# Patient Record
Sex: Female | Born: 1955 | Race: Black or African American | Hispanic: No | Marital: Single | Smoking: Current every day smoker
Health system: Southern US, Community
[De-identification: ages and names within clinical notes are randomized; demographics above are authoritative.]

## PROBLEM LIST (undated history)

## (undated) DIAGNOSIS — E785 Hyperlipidemia, unspecified: Secondary | ICD-10-CM

## (undated) DIAGNOSIS — K219 Gastro-esophageal reflux disease without esophagitis: Secondary | ICD-10-CM

## (undated) DIAGNOSIS — I639 Cerebral infarction, unspecified: Secondary | ICD-10-CM

## (undated) DIAGNOSIS — I10 Essential (primary) hypertension: Secondary | ICD-10-CM

## (undated) HISTORY — DX: Hyperlipidemia, unspecified: E78.5

## (undated) HISTORY — DX: Gastro-esophageal reflux disease without esophagitis: K21.9

## (undated) HISTORY — PX: APPENDECTOMY: SHX54

## (undated) HISTORY — DX: Essential (primary) hypertension: I10

## (undated) HISTORY — DX: Cerebral infarction, unspecified: I63.9

---

## 2010-11-05 ENCOUNTER — Inpatient Hospital Stay: Payer: Self-pay | Admitting: Internal Medicine

## 2010-11-12 ENCOUNTER — Emergency Department: Payer: Self-pay | Admitting: Unknown Physician Specialty

## 2011-01-18 ENCOUNTER — Inpatient Hospital Stay: Payer: Self-pay | Admitting: Internal Medicine

## 2013-06-15 IMAGING — CT CT ANGIOGRAPHY NECK
1 of 4 series · 12 of 33 positions shown · IV contrast (APPLIED)
Comparison: none

REASON FOR EXAM: left sided weakness, right carotid stenosis
COMMENTS:

PROCEDURE:     CT  - CT ANGIOGRAPHY NECK W/CONTRAST  - November 05, 2010  [DATE]
RESULT:
HISTORY: Left-sided weakness.

[Series 7: soft tissue · axial · 0.39mm/px · z∈[-302,-68]mm · 12 of 94 slices shown]
[im 8/94  soft-tissue]
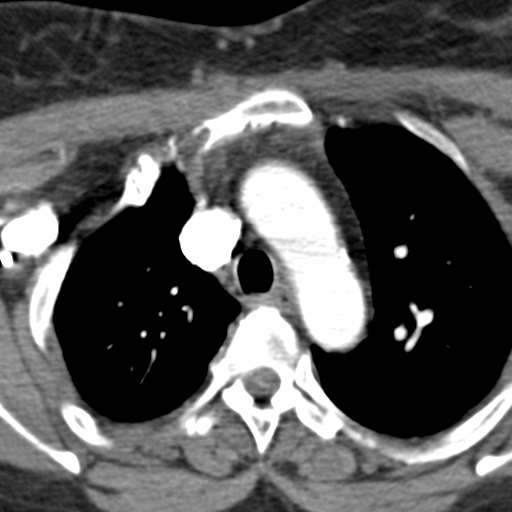
[im 15/94  bone]
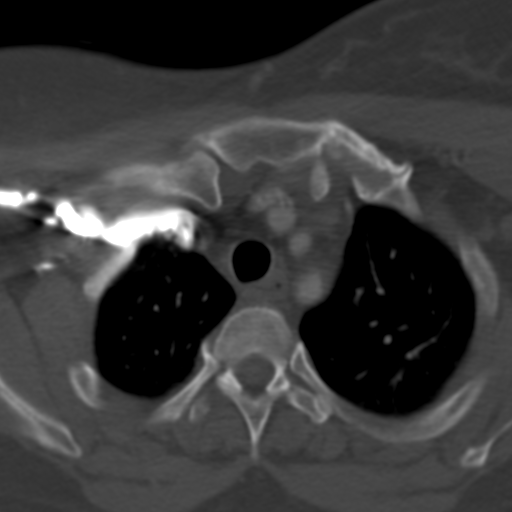
[im 22/94  soft-tissue]
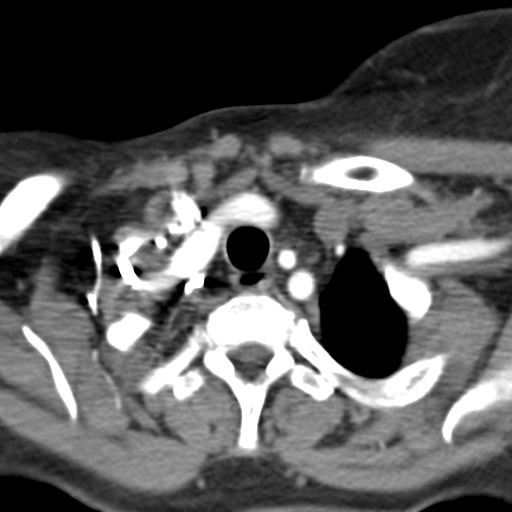
[im 29/94  bone]
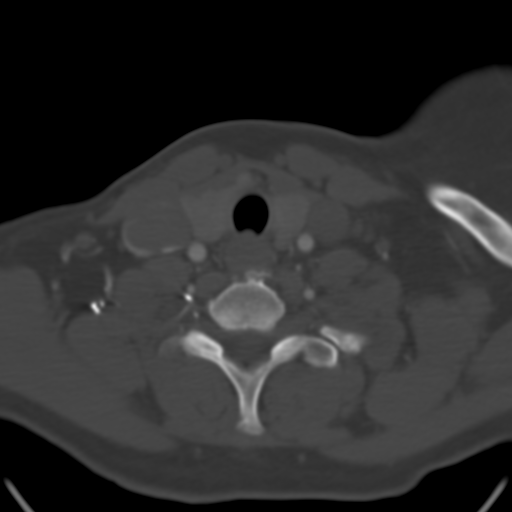
[im 36/94  soft-tissue]
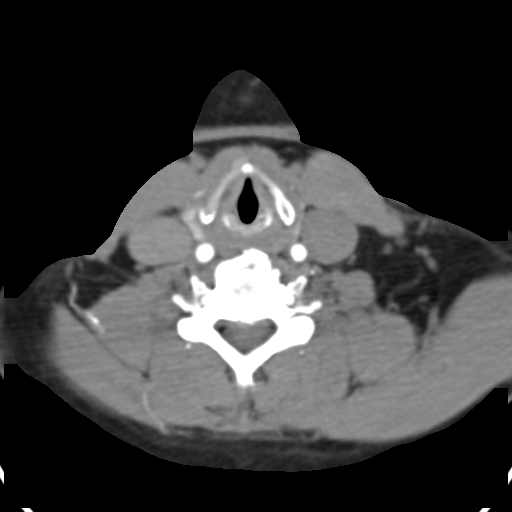
[im 43/94  bone]
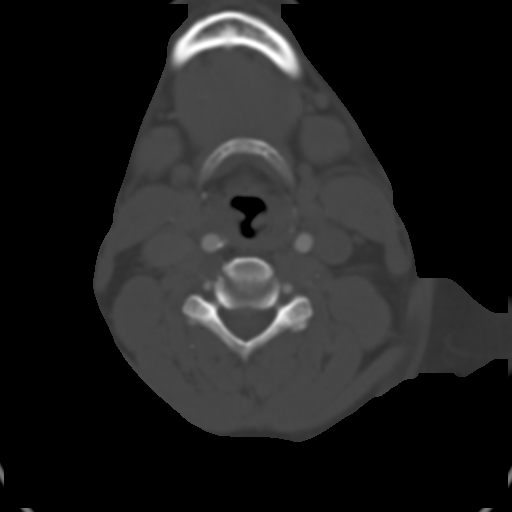
[im 51/94  soft-tissue]
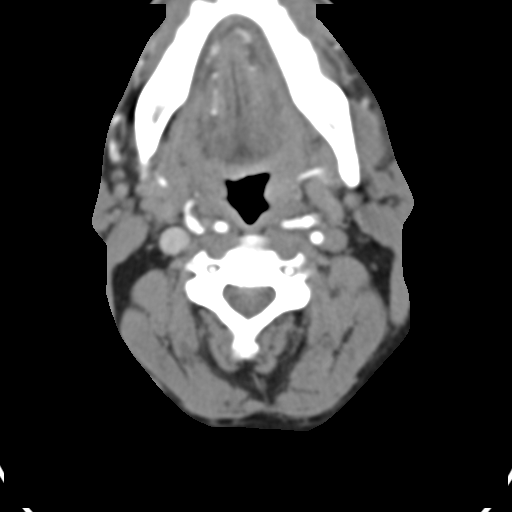
[im 58/94  bone]
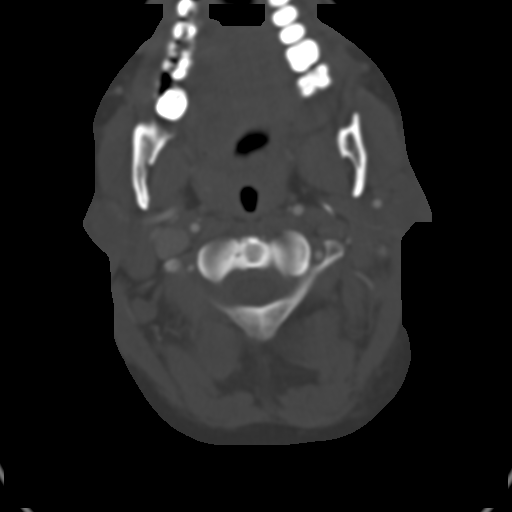
[im 65/94  soft-tissue]
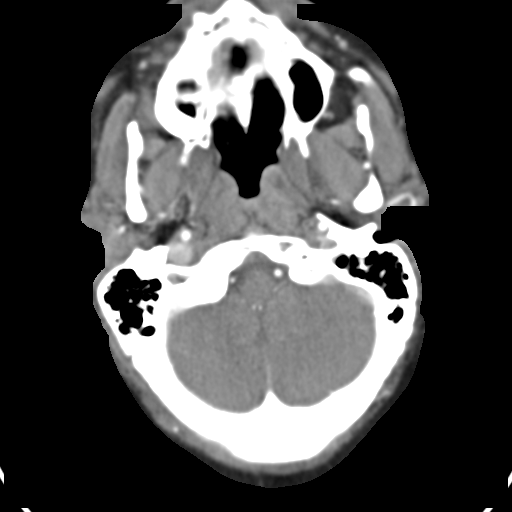
[im 72/94  bone]
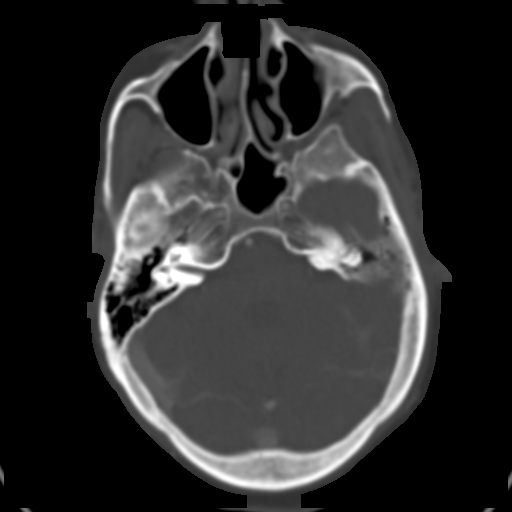
[im 79/94  soft-tissue]
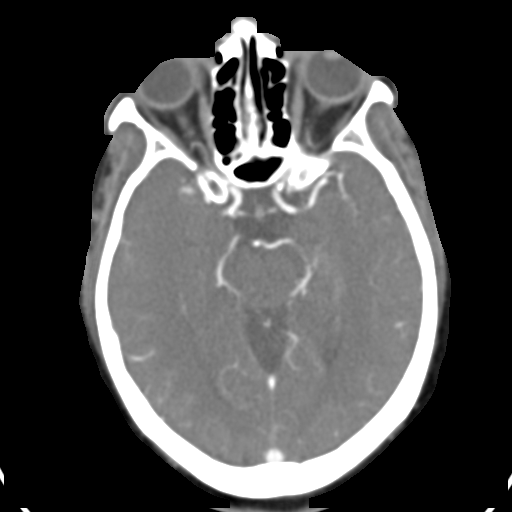
[im 86/94  bone]
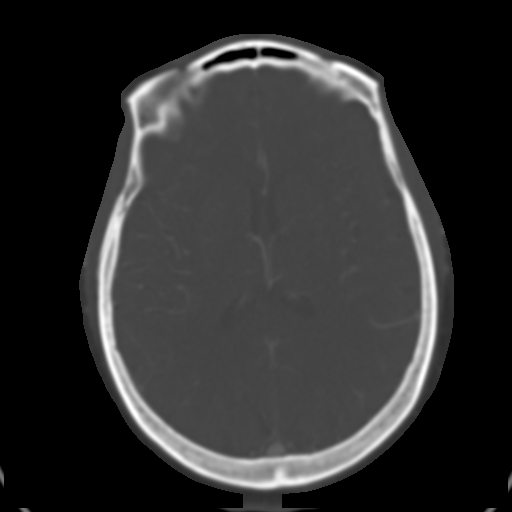

[12 of 33 positions shown; findings below may reference images not displayed]

FINDINGS: Standard CTA obtained with 100 mL of [id number]. Shotty cervical
lymph nodes are noted. Calcific atherosclerotic vascular disease is noted
with a right carotid bifurcation prominent stenosis. Proximal right internal
carotid artery stenosis is also present. The degree of stenosis is up to
75%. Mild calcific stenosis is noted of the left carotid bifurcation.
IMPRESSION: Changes suggesting hemodynamically significant stenosis right carotid
bifurcation and proximal internal carotid artery.

## 2013-08-28 IMAGING — CR DG CHEST 1V PORT
1 series · 1 of 1 positions shown · non-contrast
Comparison: none

REASON FOR EXAM: Chest Pain
COMMENTS:

PROCEDURE:     DXR - DXR PORTABLE CHEST SINGLE VIEW  - January 18, 2011  [DATE]
RESULT:     Comparison: None.

[view not recorded]
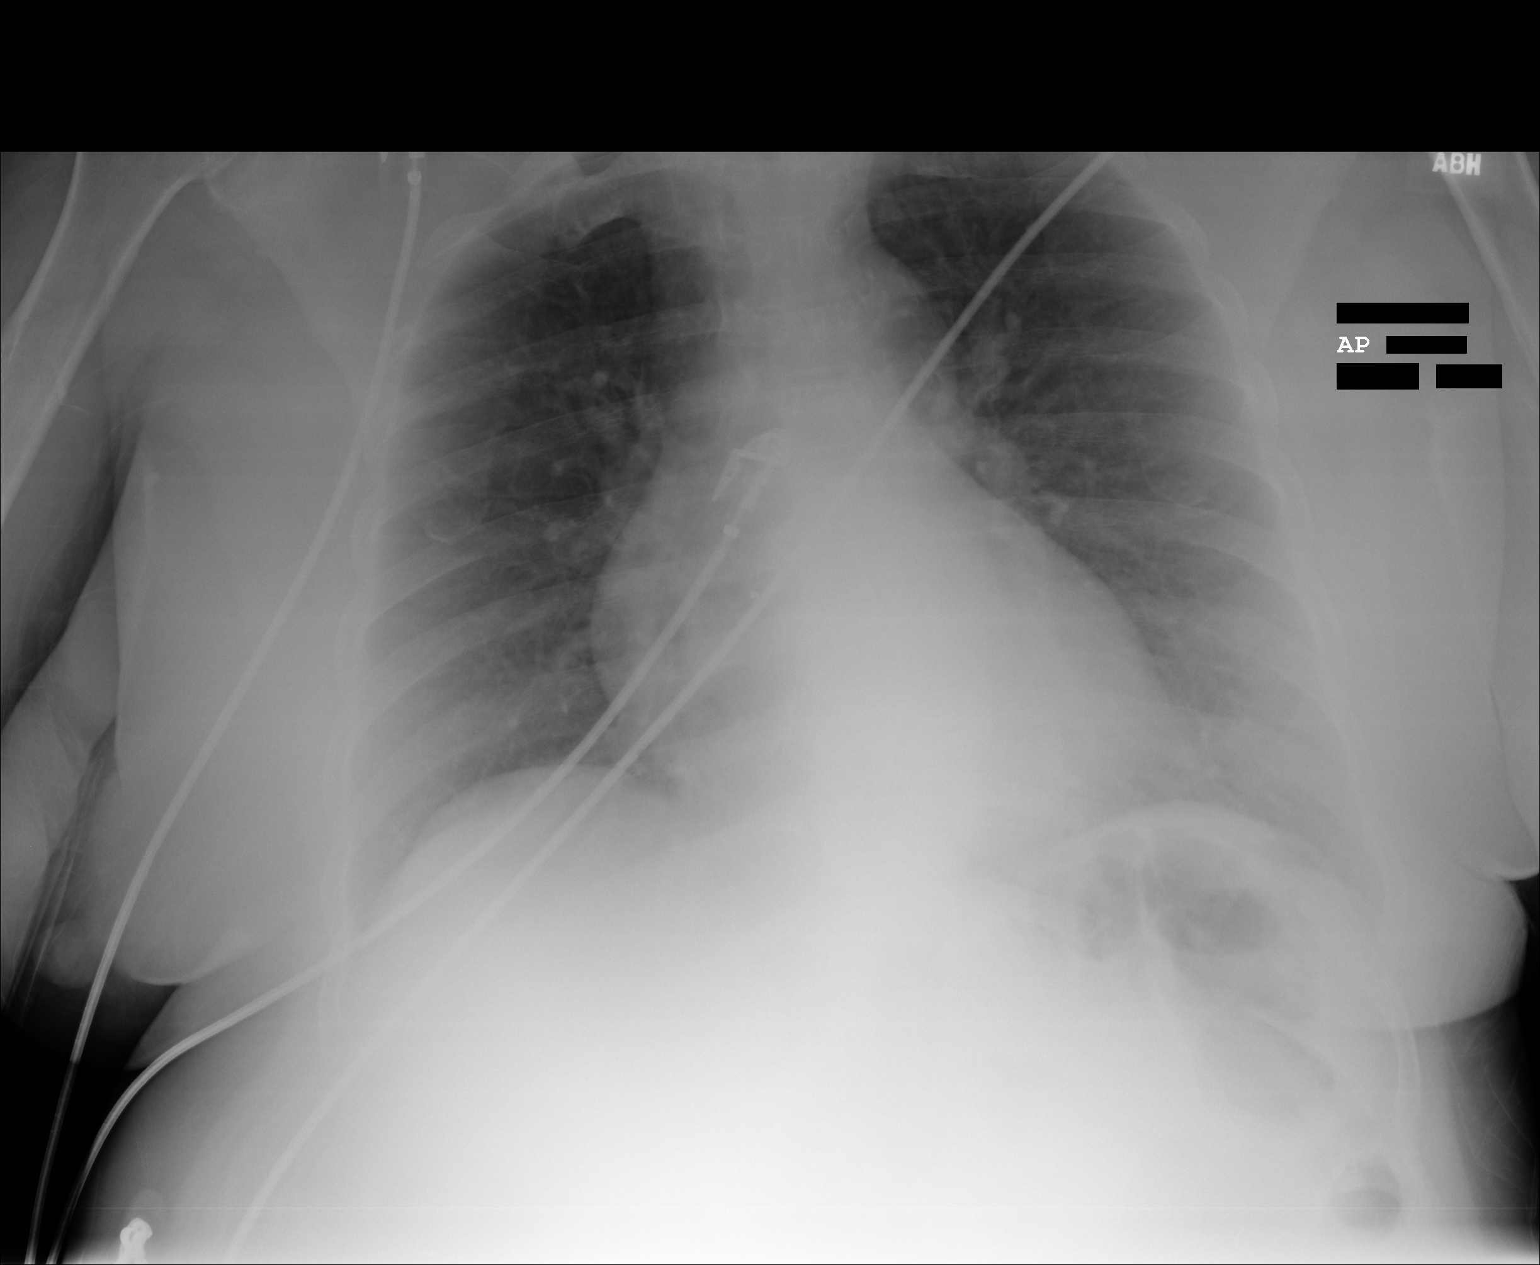

[1 of 1 positions shown; findings below may reference images not displayed]

FINDINGS: Heart size upper limits of normal to mildly enlarged. The lungs are clear.
There is an old right clavicle fracture.
IMPRESSION: No acute cardiopulmonary disease.

## 2016-04-29 DIAGNOSIS — Z8673 Personal history of transient ischemic attack (TIA), and cerebral infarction without residual deficits: Secondary | ICD-10-CM | POA: Insufficient documentation

## 2016-04-29 DIAGNOSIS — R7303 Prediabetes: Secondary | ICD-10-CM | POA: Insufficient documentation

## 2016-04-29 DIAGNOSIS — Z833 Family history of diabetes mellitus: Secondary | ICD-10-CM | POA: Insufficient documentation

## 2016-04-29 DIAGNOSIS — E7800 Pure hypercholesterolemia, unspecified: Secondary | ICD-10-CM | POA: Insufficient documentation

## 2016-06-09 DIAGNOSIS — Z720 Tobacco use: Secondary | ICD-10-CM | POA: Insufficient documentation

## 2016-07-29 DIAGNOSIS — I779 Disorder of arteries and arterioles, unspecified: Secondary | ICD-10-CM | POA: Insufficient documentation

## 2016-07-29 DIAGNOSIS — M4802 Spinal stenosis, cervical region: Secondary | ICD-10-CM | POA: Insufficient documentation

## 2016-09-15 DIAGNOSIS — K635 Polyp of colon: Secondary | ICD-10-CM | POA: Insufficient documentation

## 2017-03-24 DIAGNOSIS — E785 Hyperlipidemia, unspecified: Secondary | ICD-10-CM | POA: Insufficient documentation

## 2017-03-24 DIAGNOSIS — I10 Essential (primary) hypertension: Secondary | ICD-10-CM | POA: Insufficient documentation

## 2019-08-05 ENCOUNTER — Ambulatory Visit: Payer: Medicaid Other | Attending: Internal Medicine

## 2019-08-05 DIAGNOSIS — Z23 Encounter for immunization: Secondary | ICD-10-CM

## 2019-08-05 NOTE — Progress Notes (Signed)
   Covid-19 Vaccination Clinic  Name:  Lindsey Wilson    MRN: [id number] DOB: 01-09-1956  08/05/2019  [name] was observed post Covid-19 immunization for 30 minutes based on pre-vaccination screening without incident. She was provided with Vaccine Information Sheet and instruction to access the V-Safe system.   [name] was instructed to call 911 with any severe reactions post vaccine: Marland Kitchen Difficulty breathing  . Swelling of face and throat  . A fast heartbeat  . A bad rash all over body  . Dizziness and weakness   Immunizations Administered    Name Date Dose VIS Date Route   Pfizer COVID-19 Vaccine 08/05/2019  1:56 PM 0.3 mL 04/22/2019 Intramuscular   Manufacturer: [employer], [employer]   Lot: [id number]   NDC: [address]-2

## 2019-08-26 ENCOUNTER — Ambulatory Visit: Payer: Medicaid Other | Attending: Internal Medicine

## 2019-08-26 DIAGNOSIS — Z23 Encounter for immunization: Secondary | ICD-10-CM

## 2019-08-26 NOTE — Progress Notes (Signed)
   Covid-19 Vaccination Clinic  Name:  Lindsey Wilson    MRN: [id number] DOB: 1955/10/27  08/26/2019  Ms. Lindsey Wilson was observed post Covid-19 immunization for 15 minutes without incident. She was provided with Vaccine Information Sheet and instruction to access the V-Safe system.   Ms. Lindsey Wilson was instructed to call 911 with any severe reactions post vaccine: Marland Kitchen Difficulty breathing  . Swelling of face and throat  . A fast heartbeat  . A bad rash all over body  . Dizziness and weakness   Immunizations Administered    Name Date Dose VIS Date Route   Pfizer COVID-19 Vaccine 08/26/2019  1:40 PM 0.3 mL 04/22/2019 Intramuscular   Manufacturer: [employer], [employer]   Lot: [id number]   NDC: [address]-9

## 2020-10-16 ENCOUNTER — Emergency Department: Payer: Medicaid Other

## 2020-10-16 ENCOUNTER — Emergency Department
Admission: EM | Admit: 2020-10-16 | Discharge: 2020-10-17 | Disposition: A | Discharge status: Home / Self Care | Payer: Medicaid Other | Attending: Emergency Medicine | Admitting: Emergency Medicine

## 2020-10-16 ENCOUNTER — Encounter: Payer: Self-pay | Admitting: *Deleted

## 2020-10-16 ENCOUNTER — Other Ambulatory Visit: Payer: Self-pay

## 2020-10-16 DIAGNOSIS — F1099 Alcohol use, unspecified with unspecified alcohol-induced disorder: Secondary | ICD-10-CM | POA: Insufficient documentation

## 2020-10-16 DIAGNOSIS — R0602 Shortness of breath: Secondary | ICD-10-CM | POA: Insufficient documentation

## 2020-10-16 DIAGNOSIS — R079 Chest pain, unspecified: Secondary | ICD-10-CM | POA: Insufficient documentation

## 2020-10-16 DIAGNOSIS — Y908 Blood alcohol level of 240 mg/100 ml or more: Secondary | ICD-10-CM | POA: Insufficient documentation

## 2020-10-16 DIAGNOSIS — Z7289 Other problems related to lifestyle: Secondary | ICD-10-CM

## 2020-10-16 DIAGNOSIS — Z789 Other specified health status: Secondary | ICD-10-CM

## 2020-10-16 DIAGNOSIS — R059 Cough, unspecified: Secondary | ICD-10-CM | POA: Insufficient documentation

## 2020-10-16 DIAGNOSIS — F17200 Nicotine dependence, unspecified, uncomplicated: Secondary | ICD-10-CM | POA: Insufficient documentation

## 2020-10-16 LAB — TROPONIN I (HIGH SENSITIVITY)
Troponin I (High Sensitivity): 11 ng/L (ref <18)
Troponin I (High Sensitivity): 9 ng/L (ref <18)

## 2020-10-16 LAB — BASIC METABOLIC PANEL
Anion gap: 10 (ref 5–15)
BUN: 10 mg/dL (ref 8–23)
CO2: 23 mmol/L (ref 22–32)
Calcium: 9 mg/dL (ref 8.9–10.3)
Chloride: 106 mmol/L (ref 98–111)
Creatinine, Ser: 0.63 mg/dL (ref 0.44–1.00)
GFR, Estimated: >60 mL/min (ref >60)
Glucose, Bld: 127 mg/dL — ABNORMAL HIGH (ref 70–99)
Potassium: 3.2 mmol/L — ABNORMAL LOW (ref 3.5–5.1)
Sodium: 139 mmol/L (ref 135–145)

## 2020-10-16 LAB — CBC
HCT: 34.9 % — ABNORMAL LOW (ref 36.0–46.0)
Hemoglobin: 11.3 g/dL — ABNORMAL LOW (ref 12.0–15.0)
MCH: 31.7 pg (ref 26.0–34.0)
MCHC: 32.4 g/dL (ref 30.0–36.0)
MCV: 97.8 fL (ref 80.0–100.0)
Platelets: 207 10*3/uL (ref 150–400)
RBC: 3.57 MIL/uL — ABNORMAL LOW (ref 3.87–5.11)
RDW: 13.7 % (ref 11.5–15.5)
WBC: 6.3 10*3/uL (ref 4.0–10.5)
nRBC: 0 % (ref 0.0–0.2)

## 2020-10-16 LAB — D-DIMER, QUANTITATIVE: D-Dimer, Quant: 3.82 ug/mL-FEU — ABNORMAL HIGH (ref 0.00–0.50)

## 2020-10-16 LAB — ETHANOL: Alcohol, Ethyl (B): 266 mg/dL — ABNORMAL HIGH (ref <10)

## 2020-10-16 MED ORDER — IOHEXOL 350 MG/ML SOLN
100.0000 mL | Freq: Once | INTRAVENOUS | Status: AC | PRN
  Administered 2020-10-16: 100 mL via INTRAVENOUS

## 2020-10-16 MED ORDER — FAMOTIDINE 20 MG PO TABS: 20.0000 mg | ORAL_TABLET | Freq: Every day | ORAL | 1 refills | Status: DC

## 2020-10-16 NOTE — ED Notes (Signed)
Resumed care from zach rn.  Pt in ct scan.

## 2020-10-16 NOTE — ED Notes (Signed)
Pt sleeping. 

## 2020-10-16 NOTE — ED Triage Notes (Signed)
EMS [place] [employer] for c/o CP radiating into back 8/10, 2/10 after 1 NTG, recent cough, +ETOH

## 2020-10-16 NOTE — ED Triage Notes (Signed)
Pt to triage via wheelchair.  Pt brought in via ems from home with pain beneath left breast radiating into left mid back area.  Pt has a cough.  Pt sleepy in triage.

## 2020-10-16 NOTE — ED Provider Notes (Signed)
[facility] Emergency Department Provider Note   ____________________________________________   I have reviewed the triage vital signs and the nursing notes.   HISTORY  Chief Complaint Chest Pain   History limited by and level 5 caveat due to: Intoxication   HPI Lindsey Wilson is a 65 y.o. female who presents to the emergency department today because of concerns for chest pain.  Unfortunately history is limited secondary to patient's intoxication.  She states that the pain is located under her left breast.  She states that it was severe.  She states that she did have some shortness of breath and cough.  It is unclear exactly how long this pain has been present.  She does feel like she has had some swelling of her ankles.  Records reviewed. Per medical record review patient has a history of TIA, epigastric pain.   Prior to Admission medications   Not on File    Allergies Patient has no known allergies.  No family history on file.  Social History Social History   Tobacco Use  . Smoking status: Current Every Day Smoker  . Smokeless tobacco: Never Used  Substance Use Topics  . Alcohol use: Yes  . Drug use: Not Currently    Review of Systems Constitutional: No fever Cardiovascular: Positive for chest pain. Respiratory: Positive for shortness of breath. Gastrointestinal: No abdominal pain.  No nausea, no vomiting.  No diarrhea.   Musculoskeletal: Positive for ankle swelling.  Skin: Negative for rash. Neurological: Negative for headaches, focal weakness or numbness.  ____________________________________________   PHYSICAL EXAM:  VITAL SIGNS: ED Triage Vitals  Enc Vitals Group     BP 10/16/20 1916 113/63     Pulse Rate 10/16/20 1916 (!) 101     Resp 10/16/20 1916 20     Temp 10/16/20 1916 98.6 F (37 C)     Temp Source 10/16/20 1916 Oral     SpO2 10/16/20 1913 98 %     Weight 10/16/20 1917 193 lb (87.5 kg)     Height 10/16/20 1917  5\' 4"  (1.626 m)     Head Circumference --      Peak Flow --      Pain Score 10/16/20 1916 8   Constitutional: Intoxicated.  Eyes: Conjunctivae are normal.  ENT      Head: Normocephalic and atraumatic.      Nose: No congestion/rhinnorhea.      Mouth/Throat: Mucous membranes are moist.      Neck: No stridor. Hematological/Lymphatic/Immunilogical: No cervical lymphadenopathy. Cardiovascular: Normal rate, regular rhythm.  No murmurs, rubs, or gallops.  Respiratory: Normal respiratory effort without tachypnea nor retractions. Breath sounds are clear and equal bilaterally. No wheezes/rales/rhonchi. Gastrointestinal: Soft and non tender. No rebound. No guarding.  Genitourinary: Deferred Musculoskeletal: Normal range of motion in all extremities. Bilateral lower extremity edema.  Neurologic:  Intoxicated. Moving all extremities.  Skin:  Skin is warm, dry and intact. No rash noted. Psychiatric: Intoxicated.   ____________________________________________    LABS (pertinent positives/negatives)  Ethanol 266 Trop hs 9 CBC wbc 6.3, hgb 11.3, plt 207 BMP wnl except k 3.2, glu 127  ____________________________________________   EKG  I, 12/16/20, attending physician, personally viewed and interpreted this EKG  EKG Time: 1919 Rate: 94 Rhythm: normal sinus rhythm Axis: normal Intervals: qtc 452 QRS: narrow, q waves v1, III ST changes: no st elevation Impression: abnormal ekg  ____________________________________________    RADIOLOGY  CXR No active cardiopulmonary disease  ____________________________________________   PROCEDURES  Procedures  ____________________________________________   INITIAL IMPRESSION / ASSESSMENT AND PLAN / ED COURSE  Pertinent labs & imaging results that were available during my care of the patient were reviewed by me and considered in my medical decision making (see chart for details).   Patient presented to the emergency department  today because of concerns for chest pain.  History was limited secondary to intoxication.  2 sets of troponin were negative.  Given that the patient was complaining of some shortness of breath and swelling of her ankles I did check a D-dimer which was elevated.  This prompted a CT angio which fortunately did not show any pulmonary embolism.  Additionally did not show any pneumonia or other concerning findings.  This time somewhat unclear etiology of the chest pain but I do wonder if it could potentially be gastritis secondary to alcohol use.  Will observe for sobriety.  ____________________________________________   FINAL CLINICAL IMPRESSION(S) / ED DIAGNOSES  Final diagnoses:  Nonspecific chest pain  Alcohol use     Note: This dictation was prepared with Dragon dictation. Any transcriptional errors that result from this process are unintentional     Phineas Semen, MD 10/16/20 2349

## 2020-10-17 LAB — HEPATITIS PANEL, ACUTE
HCV Ab: REACTIVE — AB
Hep A IgM: NONREACTIVE
Hep B C IgM: NONREACTIVE
Hepatitis B Surface Ag: NONREACTIVE

## 2020-10-17 LAB — LIPASE, BLOOD: Lipase: 137 U/L — ABNORMAL HIGH (ref 11–51)

## 2020-10-17 NOTE — ED Notes (Signed)
Pt assumed to have left, no staff notified

## 2020-10-17 NOTE — ED Notes (Signed)
Provider at bedside for patient reassessment. Patient alert and oriented x4. Patient stating that she is homeless and has nowhere to go. Patient would like to speak with Child psychotherapist.

## 2020-10-18 ENCOUNTER — Telehealth: Payer: Self-pay | Admitting: Emergency Medicine

## 2020-10-18 NOTE — Telephone Encounter (Signed)
Called patient to see if she is aware of hep c result and need for further testing.  The voicemail had someone elses name, so I did not leave a message.  Will send letter.

## 2022-07-07 ENCOUNTER — Other Ambulatory Visit: Payer: Self-pay

## 2022-07-07 ENCOUNTER — Ambulatory Visit (INDEPENDENT_AMBULATORY_CARE_PROVIDER_SITE_OTHER): Payer: 59 | Admitting: Nurse Practitioner

## 2022-07-07 ENCOUNTER — Ambulatory Visit
Admission: RE | Admit: 2022-07-07 | Discharge: 2022-07-07 | Disposition: A | Discharge status: Home / Self Care | Payer: 59 | Source: Ambulatory Visit | Attending: Nurse Practitioner | Admitting: Nurse Practitioner

## 2022-07-07 ENCOUNTER — Encounter: Payer: Self-pay | Admitting: Nurse Practitioner

## 2022-07-07 ENCOUNTER — Ambulatory Visit
Admission: RE | Admit: 2022-07-07 | Discharge: 2022-07-07 | Disposition: A | Discharge status: Home / Self Care | Payer: 59 | Attending: Nurse Practitioner | Admitting: Nurse Practitioner

## 2022-07-07 VITALS — BP 128/76 | HR 102 | Temp 97.8°F | Resp 16 | Ht 64.0 in | Wt 154.8 lb

## 2022-07-07 DIAGNOSIS — Z1159 Encounter for screening for other viral diseases: Secondary | ICD-10-CM

## 2022-07-07 DIAGNOSIS — Z114 Encounter for screening for human immunodeficiency virus [HIV]: Secondary | ICD-10-CM

## 2022-07-07 DIAGNOSIS — Z1231 Encounter for screening mammogram for malignant neoplasm of breast: Secondary | ICD-10-CM | POA: Diagnosis not present

## 2022-07-07 DIAGNOSIS — E785 Hyperlipidemia, unspecified: Secondary | ICD-10-CM | POA: Diagnosis not present

## 2022-07-07 DIAGNOSIS — Z1211 Encounter for screening for malignant neoplasm of colon: Secondary | ICD-10-CM

## 2022-07-07 DIAGNOSIS — M25511 Pain in right shoulder: Secondary | ICD-10-CM

## 2022-07-07 DIAGNOSIS — I6523 Occlusion and stenosis of bilateral carotid arteries: Secondary | ICD-10-CM | POA: Diagnosis not present

## 2022-07-07 DIAGNOSIS — Z780 Asymptomatic menopausal state: Secondary | ICD-10-CM | POA: Diagnosis not present

## 2022-07-07 DIAGNOSIS — Z7689 Persons encountering health services in other specified circumstances: Secondary | ICD-10-CM | POA: Diagnosis not present

## 2022-07-07 DIAGNOSIS — Z720 Tobacco use: Secondary | ICD-10-CM

## 2022-07-07 DIAGNOSIS — Z131 Encounter for screening for diabetes mellitus: Secondary | ICD-10-CM

## 2022-07-07 DIAGNOSIS — Z833 Family history of diabetes mellitus: Secondary | ICD-10-CM | POA: Diagnosis not present

## 2022-07-07 DIAGNOSIS — S42201A Unspecified fracture of upper end of right humerus, initial encounter for closed fracture: Secondary | ICD-10-CM | POA: Diagnosis not present

## 2022-07-07 DIAGNOSIS — Z13820 Encounter for screening for osteoporosis: Secondary | ICD-10-CM

## 2022-07-07 DIAGNOSIS — Z8673 Personal history of transient ischemic attack (TIA), and cerebral infarction without residual deficits: Secondary | ICD-10-CM

## 2022-07-07 DIAGNOSIS — I10 Essential (primary) hypertension: Secondary | ICD-10-CM | POA: Diagnosis not present

## 2022-07-07 NOTE — Assessment & Plan Note (Signed)
Patient was established with vascular in 2021.  Patient is lost to follow-up.  Referral placed to vascular and neurology.

## 2022-07-07 NOTE — Assessment & Plan Note (Signed)
Not a candidate for lung cancer screening.  Less than 30-pack-year history.

## 2022-07-07 NOTE — Assessment & Plan Note (Signed)
Getting labs 

## 2022-07-07 NOTE — Assessment & Plan Note (Signed)
Blood pressure today 128/76.  Patient's been out of medication for 4 to 5 years.  Will not be restarting medication at this time.

## 2022-07-07 NOTE — Progress Notes (Signed)
BP 128/76   Pulse (!) 102   Temp 97.8 F (36.6 C) (Other (Comment))   Resp 16   Ht '5\' 4"'$  (1.626 m)   Wt 154 lb 12.8 oz (70.2 kg)   SpO2 99%   BMI 26.57 kg/m    Subjective:    Patient ID: Lindsey Wilson, female    DOB: 15-Oct-1955, 67 y.o.   MRN: [id number]  HPI: Lindsey Wilson is a 67 y.o. female  Chief Complaint  Patient presents with   Establish Care   Hypertension   Hyperlipidemia   Shoulder Injury    Right, fell   Establish care: her last physical was 4 years ago.  Medical history includes listed below.  Health maintenance due for labs, mammogram, dexa, and colonoscopy, pap, lung cancer screening.   Carotid disease/TIA:  she was diagnosed with a TIA due to visual changes to her left eye.  This improved. she was established with vascular, [facility]. She is lost to follow up. She was last seen in 09/09/2019. Ct head and neck showed:   1. There is 80% stenosis at the origin of the right ICA and 50% stenosis at  the origin of the left ICA due to calcified atherosclerosis. These findings  are stable compared to 2019.  2. Stable disc bulge at C5-6 causing severe spinal canal stenosis.  She was supposed to start plavix, see stroke neurology and follow up with vascular in 6 months.  I do not have any record where she saw neurology. Patient reports that she never took the plavix and did not go see neurology.  Will place referral to vascular and neurology.     Notes from last vascular visit : [name] is a 67 y.o. 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 Her CTA reports 50% stenosis of the left ICA that is unchanged from previous imaging. Upon personal review of the CTA, I felt that the disease burden was at worse is [id number] in the LICA. Her right ICA stenosis is stable at 80% and she is asymptomatic from that side. As such, we discussed moving forward with carotid intervention (CEA vs TCAR) on the left side for presumed symptomatic disease. She is not enthusiastic about surgery and  wanted to avoid surgery if possible. I reiterated that she was at the threshold of warranting intervention on the left ICA vs medical management in the setting of symptomatic disease, but did acknowledge that, assuming her left eye event was in fact a TIA, the source could be from a more proximal source like the heart. She made it clear that she did not want to pursue surgery unless we were sure that TIA event was real and that is was as result of the carotid disease. We understood her perspective and recommended and MRI of the brain and a consultation with stroke neurology to weigh in on the issue. She was agreeable to this and understood the risks and benefits. She was not on plavix at this time, so this was added to her medical regiment for additional protection   Htn: her blood pressure today is 128/76. She was on felodipine 10 mg daily and candesartan 4 mg daily. She says she has been out of medication for about 4-5 years.  She denies any chest pain, shortness of breath, headaches or blurred vision.  Will not restart medications at this time.   Hyperlipidemia: her last LDL was 101 on 08/31/2019.  She was on rosuvastatin 40 mg daily but she is not  currently taking it.  She has been out of her medication for 4-5 years.  Will check labs.    Tobacco use: She says she has been smoking since she was 67 years old.  She says she smokes a few cigarettes a day. Not a candidate for lung cancer screening.  Less than 30-pack-year history.  Right shoulder pain: she says she slipped and fell about three- four weeks ago.  She has right sided shoulder pain, and decreased range of motion.  Will get xray. She denies any other injury.   Relevant past medical, surgical, family and social history reviewed and updated as indicated. Interim medical history since our last visit reviewed. Allergies and medications reviewed and updated.  Review of Systems  Constitutional: Negative for fever or weight change.  Respiratory:  Negative for cough and shortness of breath.   Cardiovascular: Negative for chest pain or palpitations.  Gastrointestinal: Negative for abdominal pain, no bowel changes.  Musculoskeletal: Negative for gait problem or joint swelling. Positive for right shoulder pain and decreased ROM Skin: Negative for rash.  Neurological: Negative for dizziness or headache.  No other specific complaints in a complete review of systems (except as listed in HPI above).      Objective:    BP 128/76   Pulse (!) 102   Temp 97.8 F (36.6 C) (Other (Comment))   Resp 16   Ht '5\' 4"'$  (1.626 m)   Wt 154 lb 12.8 oz (70.2 kg)   SpO2 99%   BMI 26.57 kg/m   Wt Readings from Last 3 Encounters:  07/07/22 154 lb 12.8 oz (70.2 kg)  10/16/20 193 lb (87.5 kg)    Physical Exam  Constitutional: Patient appears well-developed and well-nourished.  No distress.  HEENT: head atraumatic, normocephalic, pupils equal and reactive to light, neck supple Cardiovascular: Normal rate, regular rhythm and normal heart sounds.  No murmur heard. No BLE edema. Pulmonary/Chest: Effort normal and breath sounds normal. No respiratory distress. Abdominal: Soft.  There is no tenderness. MSK: right shoulder tenderness, decreased ROM, good distal pulse Psychiatric: Patient has a normal mood and affect. behavior is normal. Judgment and thought content normal.  Results for orders placed or performed during the hospital encounter of [id number]  Basic metabolic panel  Result Value Ref Range   Sodium 139 135 - 145 mmol/L   Potassium 3.2 (L) 3.5 - 5.1 mmol/L   Chloride 106 98 - 111 mmol/L   CO2 23 22 - 32 mmol/L   Glucose, Bld 127 (H) 70 - 99 mg/dL   BUN 10 8 - 23 mg/dL   Creatinine, Ser 0.63 0.44 - 1.00 mg/dL   Calcium 9.0 8.9 - 10.3 mg/dL   GFR, Estimated >60 >60 mL/min   Anion gap 10 5 - 15  CBC  Result Value Ref Range   WBC 6.3 4.0 - 10.5 K/uL   RBC 3.57 (L) 3.87 - 5.11 MIL/uL   Hemoglobin 11.3 (L) 12.0 - 15.0 g/dL   HCT 34.9 (L)  36.0 - 46.0 %   MCV 97.8 80.0 - 100.0 fL   MCH 31.7 26.0 - 34.0 pg   MCHC 32.4 30.0 - 36.0 g/dL   RDW 13.7 11.5 - 15.5 %   Platelets 207 150 - 400 K/uL   nRBC 0.0 0.0 - 0.2 %  Ethanol  Result Value Ref Range   Alcohol, Ethyl (B) 266 (H) <10 mg/dL  D-dimer, quantitative  Result Value Ref Range   D-Dimer, Quant 3.82 (H) 0.00 - 0.50 ug/mL-FEU  Lipase, blood  Result Value Ref Range   Lipase 137 (H) 11 - 51 U/L  Hepatitis panel, acute  Result Value Ref Range   Hepatitis B Surface Ag NON REACTIVE NON REACTIVE   HCV Ab Reactive (A) NON REACTIVE   Hep A IgM NON REACTIVE NON REACTIVE   Hep B C IgM NON REACTIVE NON REACTIVE  Troponin I (High Sensitivity)  Result Value Ref Range   Troponin I (High Sensitivity) 11 <18 ng/L  Troponin I (High Sensitivity)  Result Value Ref Range   Troponin I (High Sensitivity) 9 <18 ng/L      Assessment & Plan:   Problem List Items Addressed This Visit       Cardiovascular and Mediastinum   Carotid disease, bilateral (Central) (Chronic)    Patient was established with vascular in 2021.  Patient is lost to follow-up.  Referral placed to vascular and neurology.      Relevant Orders   Ambulatory referral to Vascular Surgery   Hypertension goal BP (blood pressure) < 130/80    Blood pressure today 128/76.  Patient's been out of medication for 4 to 5 years.  Will not be restarting medication at this time.      Relevant Orders   CBC with Differential/Platelet   COMPLETE METABOLIC PANEL WITH GFR     Other   Family history of type 2 diabetes mellitus (Chronic)    Getting labs      Relevant Orders   COMPLETE METABOLIC PANEL WITH GFR   Hemoglobin A1c   Hyperlipidemia LDL goal <70   Relevant Orders   COMPLETE METABOLIC PANEL WITH GFR   Lipid panel   Tobacco use    Not a candidate for lung cancer screening.  Less than 30-pack-year history.      History of TIA (transient ischemic attack) - Primary    Patient was established with vascular in 2021.   Patient is lost to follow-up.  Referral placed to vascular and neurology.      Relevant Orders   Lipid panel   Ambulatory referral to Neurology   Ambulatory referral to Vascular Surgery   Other Visit Diagnoses     Encounter to establish care       established care   Acute pain of right shoulder       right shoulder xray ordered   Relevant Orders   DG Shoulder Right   Encounter for screening mammogram for malignant neoplasm of breast       Relevant Orders   MM 3D SCREEN BREAST BILATERAL   Screening for colon cancer       Relevant Orders   Ambulatory referral to Gastroenterology   Screening for osteoporosis       Relevant Orders   DG Bone Density   Postmenopausal estrogen deficiency       Relevant Orders   DG Bone Density   Screening for HIV without presence of risk factors       Relevant Orders   HIV Antibody (routine testing w rflx)   Encounter for hepatitis C screening test for low risk patient       Relevant Orders   Hepatitis C antibody   Screening for diabetes mellitus       Relevant Orders   COMPLETE METABOLIC PANEL WITH GFR   Hemoglobin A1c        Follow up plan: Return in about 6 months (around 01/05/2023) for follow up.

## 2022-07-08 ENCOUNTER — Telehealth: Payer: Self-pay

## 2022-07-08 LAB — COMPLETE METABOLIC PANEL WITH GFR
AG Ratio: 1.5 (calc) (ref 1.0–2.5)
ALT: 8 U/L (ref 6–29)
AST: 16 U/L (ref 10–35)
Albumin: 4.5 g/dL (ref 3.6–5.1)
Alkaline phosphatase (APISO): 113 U/L (ref 37–153)
BUN: 15 mg/dL (ref 7–25)
CO2: 25 mmol/L (ref 20–32)
Calcium: 10 mg/dL (ref 8.6–10.4)
Chloride: 104 mmol/L (ref 98–110)
Creat: 0.58 mg/dL (ref 0.50–1.05)
Globulin: 3 g/dL (calc) (ref 1.9–3.7)
Glucose, Bld: 112 mg/dL — ABNORMAL HIGH (ref 65–99)
Potassium: 4 mmol/L (ref 3.5–5.3)
Sodium: 141 mmol/L (ref 135–146)
Total Bilirubin: 0.3 mg/dL (ref 0.2–1.2)
Total Protein: 7.5 g/dL (ref 6.1–8.1)
eGFR: 100 mL/min/{1.73_m2} (ref >=60)

## 2022-07-08 LAB — CBC WITH DIFFERENTIAL/PLATELET
Absolute Monocytes: 585 {cells}/uL (ref 200–950)
Basophils Absolute: 30 {cells}/uL (ref 0–200)
Basophils Relative: 0.6 %
Eosinophils Absolute: 50 {cells}/uL (ref 15–500)
Eosinophils Relative: 1 %
HCT: 38.5 % (ref 35.0–45.0)
Hemoglobin: 13.1 g/dL (ref 11.7–15.5)
Lymphs Abs: 1465 {cells}/uL (ref 850–3900)
MCH: 30.6 pg (ref 27.0–33.0)
MCHC: 34 g/dL (ref 32.0–36.0)
MCV: 90 fL (ref 80.0–100.0)
MPV: 10.7 fL (ref 7.5–12.5)
Monocytes Relative: 11.7 %
Neutro Abs: 2870 {cells}/uL (ref 1500–7800)
Neutrophils Relative %: 57.4 %
Platelets: 289 Thousand/uL (ref 140–400)
RBC: 4.28 Million/uL (ref 3.80–5.10)
RDW: 12.1 % (ref 11.0–15.0)
Total Lymphocyte: 29.3 %
WBC: 5 Thousand/uL (ref 3.8–10.8)

## 2022-07-08 LAB — HEMOGLOBIN A1C
Hgb A1c MFr Bld: 5.6 % of total Hgb (ref <5.7)
Mean Plasma Glucose: 114 mg/dL
eAG (mmol/L): 6.3 mmol/L

## 2022-07-08 LAB — LIPID PANEL
Cholesterol: 203 mg/dL — ABNORMAL HIGH (ref <200)
HDL: 81 mg/dL (ref >=50)
LDL Cholesterol (Calc): 94 mg/dL (calc)
Non-HDL Cholesterol (Calc): 122 mg/dL (calc) (ref <130)
Total CHOL/HDL Ratio: 2.5 (calc) (ref <5.0)
Triglycerides: 180 mg/dL — ABNORMAL HIGH (ref <150)

## 2022-07-08 LAB — HIV ANTIBODY (ROUTINE TESTING W REFLEX): HIV 1&2 Ab, 4th Generation: NONREACTIVE

## 2022-07-08 LAB — HEPATITIS C ANTIBODY: Hepatitis C Ab: NONREACTIVE

## 2022-07-08 NOTE — Telephone Encounter (Signed)
Pt has been contacted to schedule her colonoscopy.  She is not ready to schedule at this time.  Will send her a reminder letter to call office when she is ready to schedule.  Thanks,  [place], [place]

## 2022-07-09 ENCOUNTER — Other Ambulatory Visit: Payer: Self-pay | Admitting: Nurse Practitioner

## 2022-07-09 DIAGNOSIS — E785 Hyperlipidemia, unspecified: Secondary | ICD-10-CM

## 2022-07-09 MED ORDER — ROSUVASTATIN CALCIUM 40 MG PO TABS: 40.0000 mg | ORAL_TABLET | Freq: Every day | ORAL | 3 refills | Status: AC

## 2022-07-11 ENCOUNTER — Other Ambulatory Visit: Payer: Self-pay | Admitting: Nurse Practitioner

## 2022-07-15 ENCOUNTER — Encounter (INDEPENDENT_AMBULATORY_CARE_PROVIDER_SITE_OTHER): Payer: 59 | Admitting: Vascular Surgery

## 2022-07-22 ENCOUNTER — Telehealth: Payer: Self-pay

## 2022-07-22 NOTE — Telephone Encounter (Signed)
Contacted patient this morning to confirm her mailing address.  She confirmed the address on file is the correct address.  She is still not ready to schedule her colonoscopy.  Informed her I will put the reminder letter in the mail again.  Thanks, [place], [place]

## 2022-07-22 NOTE — Telephone Encounter (Signed)
We received return mail for patient for patient letter informing patient to call and schedule colonoscopy.

## 2022-07-22 NOTE — Telephone Encounter (Signed)
Referral reminder letter reprinted and mailed to patient.  Thanks, [place], [place]

## 2022-08-11 DIAGNOSIS — S42201A Unspecified fracture of upper end of right humerus, initial encounter for closed fracture: Secondary | ICD-10-CM | POA: Diagnosis not present

## 2022-09-15 ENCOUNTER — Telehealth: Payer: Self-pay | Admitting: Nurse Practitioner

## 2022-09-15 NOTE — Telephone Encounter (Signed)
Contacted Lindsey Wilson to schedule their annual wellness visit. Appointment made for 10/10/2022.  [facility] Care Guide [facility] AW[facility] TEAM Direct Dial: [phone]

## 2022-10-10 ENCOUNTER — Ambulatory Visit: Payer: 59

## 2023-01-05 ENCOUNTER — Ambulatory Visit: Payer: 59 | Admitting: Nurse Practitioner

## 2023-01-05 NOTE — Progress Notes (Deleted)
There were no vitals taken for this visit.   Subjective:    Patient ID: Lindsey Wilson, female    DOB: [birth year], 67 y.o.   MRN: [id number]  HPI: Lindsey Wilson is a 67 y.o. female  No chief complaint on file.   Carotid disease/TIA:  she was diagnosed with a TIA due to visual changes to her left eye.  This improved. she was established with vascular, [facility]. She is lost to follow up. She was last seen in 09/09/2019. Ct head and neck showed:   1. There is 80% stenosis at the origin of the right ICA and 50% stenosis at  the origin of the left ICA due to calcified atherosclerosis. These findings  are stable compared to 2019.  2. Stable disc bulge at C5-6 causing severe spinal canal stenosis.  She was supposed to start plavix, see stroke neurology and follow up with vascular in 6 months.  I do not have any record where she saw neurology. Patient reports that she never took the plavix and did not go see neurology.  Placed referral to neurology and she no showed the appointment. Placed referral to vascular and patient told them she did not need an appointment.      Notes from last vascular visit : Rether is a 67 y.o. 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 Her CTA reports 50% stenosis of the left ICA that is unchanged from previous imaging. Upon personal review of the CTA, I felt that the disease burden was at worse is 40% in the LICA. Her right ICA stenosis is stable at 80% and she is asymptomatic from that side. As such, we discussed moving forward with carotid intervention (CEA vs TCAR) on the left side for presumed symptomatic disease. She is not enthusiastic about surgery and wanted to avoid surgery if possible. I reiterated that she was at the threshold of warranting intervention on the left ICA vs medical management in the setting of symptomatic disease, but did acknowledge that, assuming her left eye event was in fact a TIA, the source could be from a more proximal source like  the heart. She made it clear that she did not want to pursue surgery unless we were sure that TIA event was real and that is was as result of the carotid disease. We understood her perspective and recommended and MRI of the brain and a consultation with stroke neurology to weigh in on the issue. She was agreeable to this and understood the risks and benefits. She was not on plavix at this time, so this was added to her medical regiment for additional protection   Hypertension:  -Medications: none currently, previously on felodipine 10 mg daily and candesartan 4 mg daily -Checking BP at home (average): her blood pressure at last visit was 128/76, chose to not restart medication.  -Denies any SOB, CP, vision changes, LE edema or symptoms of hypotension  HLD:  -Medications: rosuvastatin 40 mg daily -Patient is compliant with above medications and reports no side effects.  -Last lipid panel:  Lipid Panel     Component Value Date/Time   CHOL 203 (H) 07/07/2022 1516   TRIG 180 (H) 07/07/2022 1516   HDL 81 07/07/2022 1516   CHOLHDL 2.5 07/07/2022 1516   LDLCALC 94 07/07/2022 1516      Tobacco use: She says she has been smoking since she was 67 years old.  She says she smokes a few cigarettes a day. Not a candidate for  lung cancer screening.  Less than 30-pack-year history.   Relevant past medical, surgical, family and social history reviewed and updated as indicated. Interim medical history since our last visit reviewed. Allergies and medications reviewed and updated.  Review of Systems  Constitutional: Negative for fever or weight change.  Respiratory: Negative for cough and shortness of breath.   Cardiovascular: Negative for chest pain or palpitations.  Gastrointestinal: Negative for abdominal pain, no bowel changes.  Musculoskeletal: Negative for gait problem or joint swelling.  Skin: Negative for rash.  Neurological: Negative for dizziness or headache.  No other specific complaints  in a complete review of systems (except as listed in HPI above).      Objective:    There were no vitals taken for this visit.  Wt Readings from Last 3 Encounters:  07/07/22 154 lb 12.8 oz (70.2 kg)  10/16/20 193 lb (87.5 kg)    Physical Exam  Constitutional: Patient appears well-developed and well-nourished.  No distress.  HEENT: head atraumatic, normocephalic, pupils equal and reactive to light, neck supple Cardiovascular: Normal rate, regular rhythm and normal heart sounds.  No murmur heard. No BLE edema. Pulmonary/Chest: Effort normal and breath sounds normal. No respiratory distress. Abdominal: Soft.  There is no tenderness. Psychiatric: Patient has a normal mood and affect. behavior is normal. Judgment and thought content normal.  Results for orders placed or performed in visit on 07/07/22  CBC with Differential/Platelet  Result Value Ref Range   WBC 5.0 3.8 - 10.8 Thousand/uL   RBC 4.28 3.80 - 5.10 Million/uL   Hemoglobin 13.1 11.7 - 15.5 g/dL   HCT 40.1 02.7 - 25.3 %   MCV 90.0 80.0 - 100.0 fL   MCH 30.6 27.0 - 33.0 pg   MCHC 34.0 32.0 - 36.0 g/dL   RDW 66.4 40.3 - 47.4 %   Platelets 289 140 - 400 Thousand/uL   MPV 10.7 7.5 - 12.5 fL   Neutro Abs 2,870 1,500 - 7,800 cells/uL   Lymphs Abs 1,465 850 - 3,900 cells/uL   Absolute Monocytes 585 200 - 950 cells/uL   Eosinophils Absolute 50 15 - 500 cells/uL   Basophils Absolute 30 0 - 200 cells/uL   Neutrophils Relative % 57.4 %   Total Lymphocyte 29.3 %   Monocytes Relative 11.7 %   Eosinophils Relative 1.0 %   Basophils Relative 0.6 %  COMPLETE METABOLIC PANEL WITH GFR  Result Value Ref Range   Glucose, Bld 112 (H) 65 - 99 mg/dL   BUN 15 7 - 25 mg/dL   Creat 2.59 5.63 - 8.75 mg/dL   eGFR 643 > OR = 60 PI/RJJ/8.84Z6   BUN/Creatinine Ratio SEE NOTE: 6 - 22 (calc)   Sodium 141 135 - 146 mmol/L   Potassium 4.0 3.5 - 5.3 mmol/L   Chloride 104 98 - 110 mmol/L   CO2 25 20 - 32 mmol/L   Calcium 10.0 8.6 - 10.4 mg/dL    Total Protein 7.5 6.1 - 8.1 g/dL   Albumin 4.5 3.6 - 5.1 g/dL   Globulin 3.0 1.9 - 3.7 g/dL (calc)   AG Ratio 1.5 1.0 - 2.5 (calc)   Total Bilirubin 0.3 0.2 - 1.2 mg/dL   Alkaline phosphatase (APISO) 113 37 - 153 U/L   AST 16 10 - 35 U/L   ALT 8 6 - 29 U/L  Lipid panel  Result Value Ref Range   Cholesterol 203 (H) <200 mg/dL   HDL 81 > OR = 50 mg/dL   Triglycerides  180 (H) <150 mg/dL   LDL Cholesterol (Calc) 94 mg/dL (calc)   Total CHOL/HDL Ratio 2.5 <5.0 (calc)   Non-HDL Cholesterol (Calc) 122 <130 mg/dL (calc)  Hemoglobin Z6X  Result Value Ref Range   Hgb A1c MFr Bld 5.6 <5.7 % of total Hgb   Mean Plasma Glucose 114 mg/dL   eAG (mmol/L) 6.3 mmol/L  Hepatitis C antibody  Result Value Ref Range   Hepatitis C Ab NON-REACTIVE NON-REACTIVE  HIV Antibody (routine testing w rflx)  Result Value Ref Range   HIV 1&2 Ab, 4th Generation NON-REACTIVE NON-REACTIVE      Assessment & Plan:   Problem List Items Addressed This Visit   None     Follow up plan: No follow-ups on file.

## 2023-05-27 IMAGING — CR DG CHEST 2V
2 series · 2 of 2 positions shown · non-contrast
Comparison: 01/18/2011

CLINICAL DATA: Chest pain

EXAM:
CHEST - 2 VIEW

[chest lat]
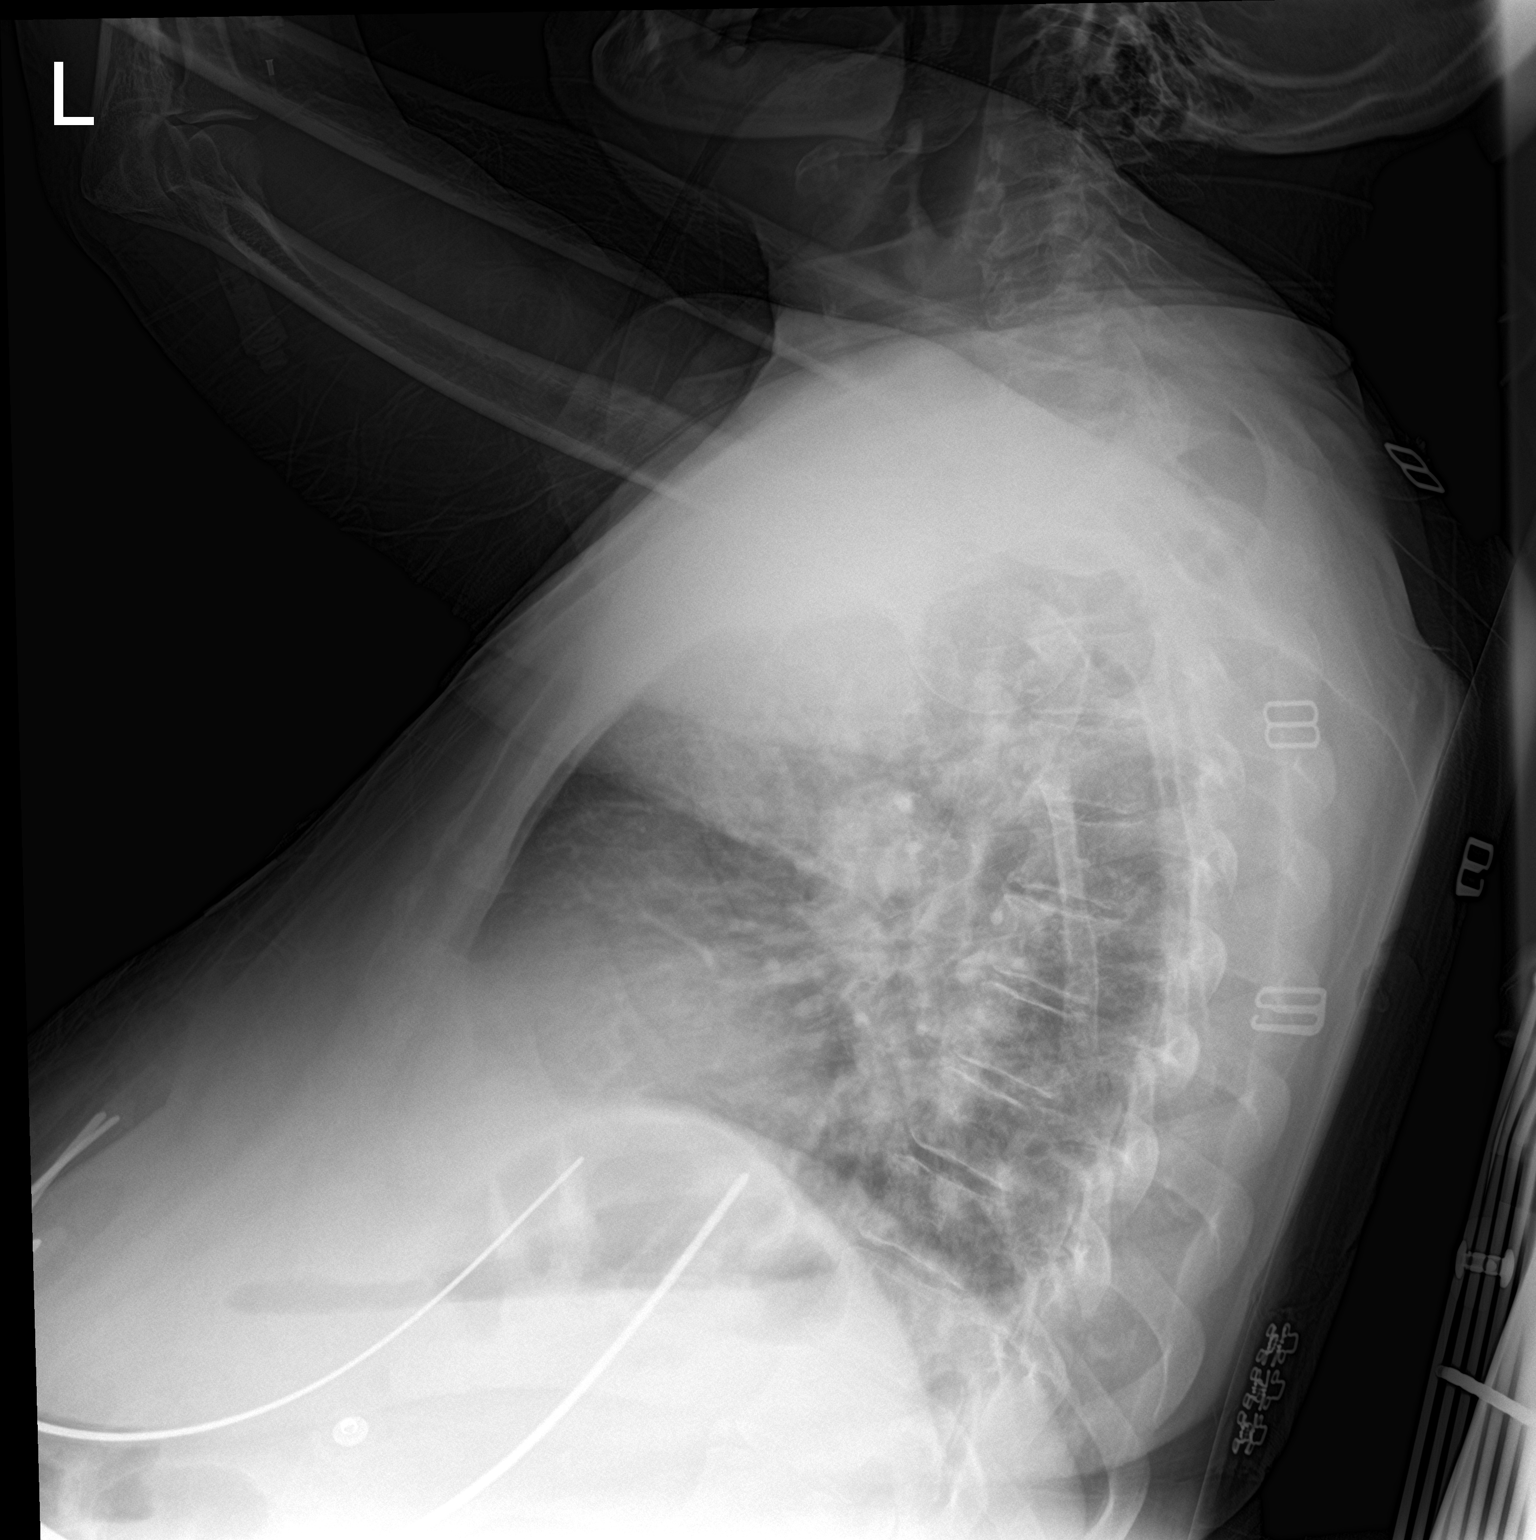

[chest ap]
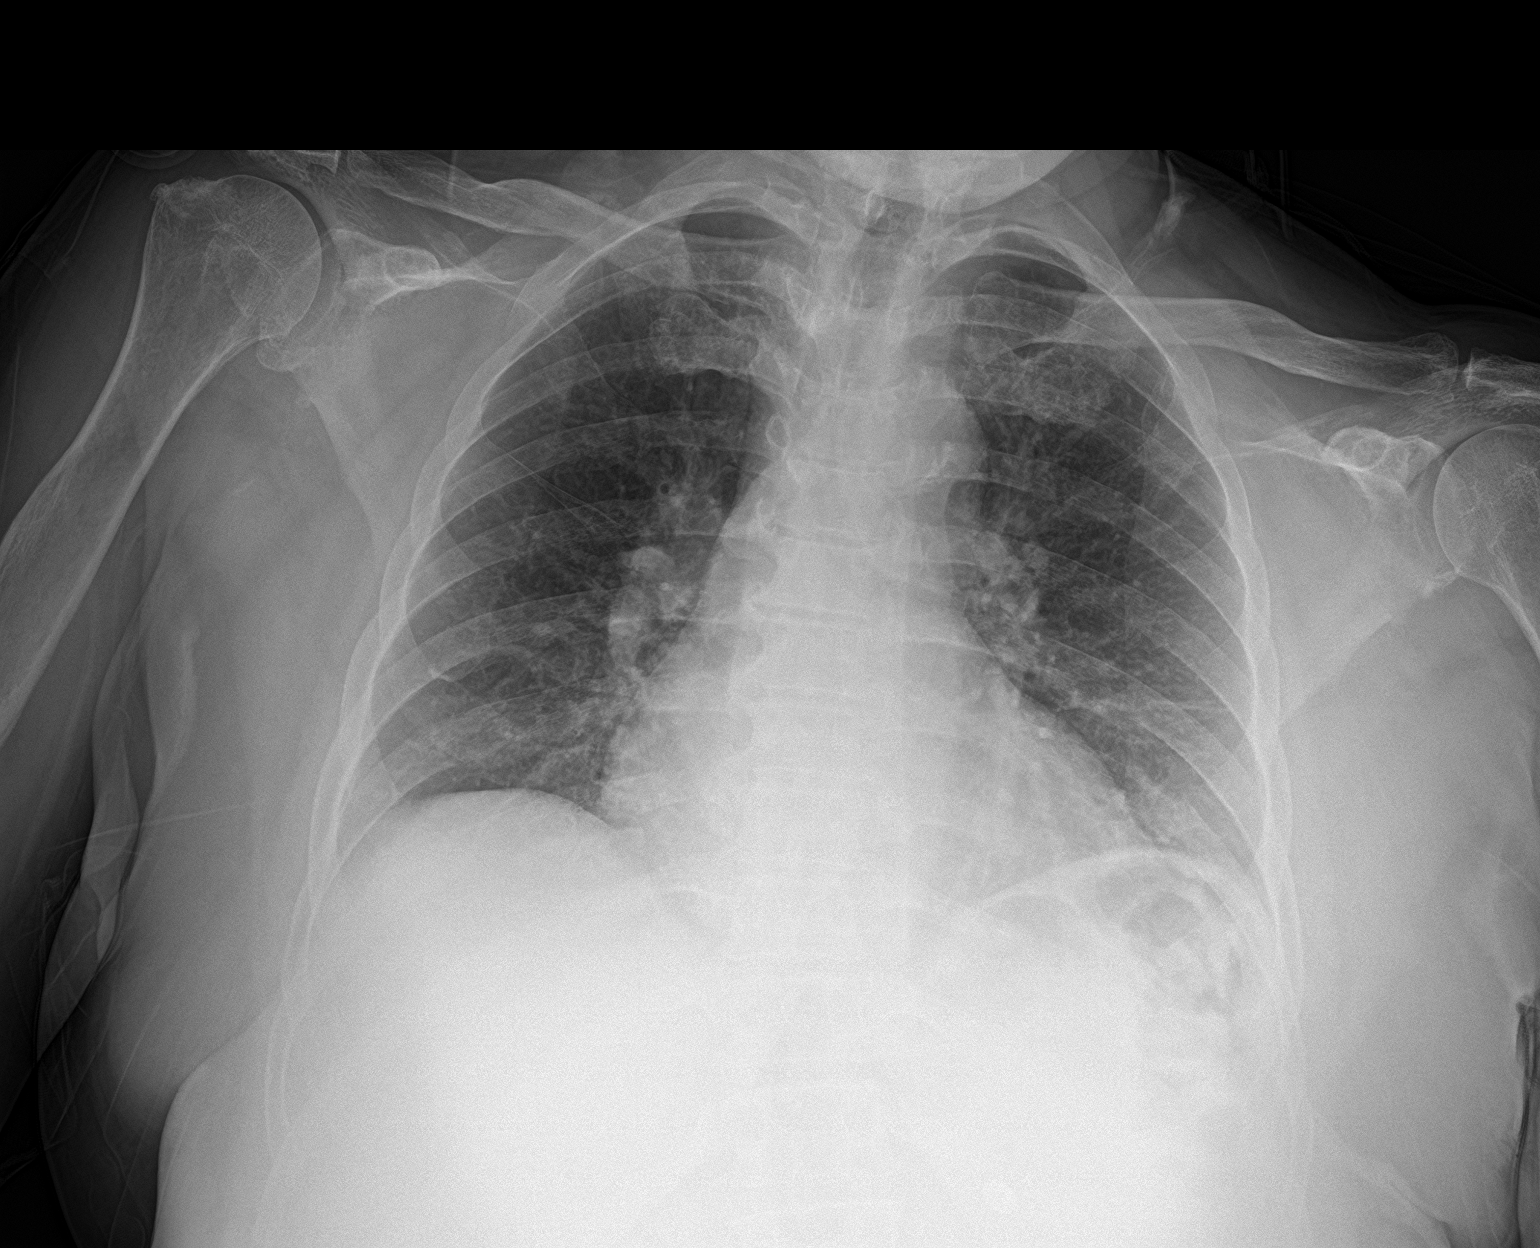

[2 of 2 positions shown; findings below may reference images not displayed]

FINDINGS: Mild left basilar atelectasis is present. The lungs are otherwise
clear. No pneumothorax or pleural effusion. Cardiac size within
normal limits. The pulmonary vascularity is normal. No acute bone
abnormality.
IMPRESSION: No active cardiopulmonary disease.

## 2023-09-09 ENCOUNTER — Telehealth: Payer: Self-pay

## 2023-09-09 NOTE — Telephone Encounter (Signed)
 Medical records request was sent to [facility] from [facility]) on 09/09/2023

## 2024-02-04 NOTE — Progress Notes (Signed)
 MILI PILTZ                                          MRN: [id number]   02/04/2024   The [facility] Quality Team Specialist reviewed this patient medical record for the purposes of chart review for care gap closure. The following were reviewed: chart review for care gap closure-colorectal cancer screening.    [facility] Quality Team

## 2024-03-14 ENCOUNTER — Telehealth: Payer: Self-pay

## 2024-03-14 NOTE — Telephone Encounter (Signed)
 Tried to call patient and make appt for Lindsey Wilson. No answer. VM full.

## 2024-03-18 ENCOUNTER — Ambulatory Visit: Payer: Self-pay

## 2024-03-18 NOTE — Telephone Encounter (Signed)
 FYI Only or Action Required?: FYI only for provider: ED advised.  Patient was last seen in primary care on 07/07/2022 by Gareth Mliss FALCON, FNP.  Called Nurse Triage reporting Numbness.  Symptoms began about a month ago.  Interventions attempted: Rest, hydration, or home remedies.  Symptoms are: gradually worsening.  Triage Disposition: Go to ED Now (Notify PCP)  Patient/caregiver understands and will follow disposition?: Yes  Reason for Disposition  [1] Numbness (i.e., loss of sensation) of the face, arm / hand, or leg / foot on one side of the body AND [2] sudden onset AND [3] brief (now gone)  Answer Assessment - Initial Assessment Questions Pt reports onset of intermittent numbness and weakness in left arm and hand 1 month ago. Also reports onset of difficulty getting words out, feeling off balance, moderate SOB, weakness, and high blood pressure 1 month ago. Unable to recall exact blood pressure and no cuff available at this time. Speech a little slurred over the phone but pt AAO. Hx of CVA in the past and pt thinks she may have had a mini-stroke a month ago. Pt initially asking if she can make an office appt. Advised ED now given symptoms. Pt reports there is someone there with her now that can take her.  1. SYMPTOM: What is the main symptom you are concerned about? (e.g., weakness, numbness)     Severe numbness of left hand and arm. Unable to grasp objects. Able to lift arm  2. ONSET: When did this start? (e.g., minutes, hours, days; while sleeping)     1 month  3. LAST NORMAL: When was the last time you (the patient) were normal (no symptoms)?     1 month ago  4. PATTERN Does this come and go, or has it been constant since it started?  Is it present now?     Numbness in left arm comes and goes  5. CARDIAC SYMPTOMS: Have you had any of the following symptoms: chest pain, difficulty breathing, palpitations?     Htn, bilateral carotid disease. Hx of CVA.  6.  NEUROLOGIC SYMPTOMS: Have you had any of the following symptoms: headache, dizziness, vision loss, double vision, changes in speech, unsteady on your feet?     Has trouble getting words out starting 1 month ago. Feeling off balance 1 month ago  7. OTHER SYMPTOMS: Do you have any other symptoms?     Left arm looks dusky. Feels mildly cool. Reports thinking she may have had a mini stroke 1 month ago d/t having difficulty getting words out but salvia comes out. Reports BP has been running high but can't recall exact number. Head cold past 2-3 months. Moderate SOB. Denies CP. Upper back pain x1 month. Weakness x1 month.  Protocols used: Neurologic Deficit-A-AH

## 2024-03-18 NOTE — Telephone Encounter (Signed)
 Call dropped prior to transfer- Attempted to reach patient to discuss- mailbox full

## 2024-03-18 NOTE — Telephone Encounter (Signed)
 Copied from CRM #[id number]. Topic: Clinical - Red Word Triage >> Mar 18, 2024 12:21 PM Lindsey Wilson wrote: [facility] that prompted transfer to Nurse Triage: Left hand and left arm is numb.. few mini strokes.. she isn't regulary going poo but is constantly peeing

## 2024-03-25 NOTE — Telephone Encounter (Signed)
 Patient is on the [facility] list as not completing a PCP visit for 2025. PCP listed is [facility].   Unable to leave a vm because vm was full.

## 2024-05-02 ENCOUNTER — Ambulatory Visit: Payer: Self-pay

## 2024-05-02 NOTE — Telephone Encounter (Signed)
 FYI Only or Action Required?: FYI only for provider: appointment scheduled on 05/06/24.  Patient was last seen in primary care on 07/07/2022 by Gareth Mliss FALCON, FNP.  Called Nurse Triage reporting Hypertension.  Symptoms began today.  Interventions attempted: Nothing.  Symptoms are: unchanged.  Triage Disposition: See PCP When Office is Open (Within 3 Days)  Patient/caregiver understands and will follow disposition?: Yes  Reason for Disposition  Systolic BP >= 160 OR Diastolic >= 100  Answer Assessment - Initial Assessment Questions Spoke with Carly, NP that is performing a home assessment of patient with [employer]. She reports that patient has upper respiratory infection that started about 3-4 days ago. While performing patient assessment she found that her blood pressure was 168/88 and 166/90. She mentions the possibility of OTC cold medications causing this, but patient has not had any since last night. She states that patient used to take blood pressure medicine when she lived in the [place] area, but has not had any since moving to current area. She denies any symptoms. NP also reports that she got a point of care A1C reading that is 6.2, but patient has no history of pre diabetes. She also reports unknown murmur on assessment. Office visit advised.   1. BLOOD PRESSURE: What is your blood pressure? Did you take at least two measurements 5 minutes apart?     168/88 and 166/90  2. ONSET: When did you take your blood pressure?     Today 12/22, within 20 mins of call  3. HOW: How did you take your blood pressure? (e.g., automatic home BP monitor, visiting nurse)     Visiting NP  4. HISTORY: Do you have a history of high blood pressure?     Yes  5. MEDICINES: Are you taking any medicines for blood pressure? Have you missed any doses recently?     Not currently taking any medications  6. OTHER SYMPTOMS: Do you have any symptoms? (e.g., blurred vision, chest  pain, difficulty breathing, headache, weakness)     Reports that patient denies any other symptoms  7. PREGNANCY: Is there any chance you are pregnant? When was your last menstrual period?     NA  Protocols used: Blood Pressure - High-A-AH  Copied from CRM [id number]. Topic: Clinical - Red Word Triage >> May 02, 2024  2:22 PM Donna BRAVO wrote: Red Word that prompted transfer to Nurse Triage:   Carly NP with [facility] Call[facility]  12/ 22/25   with in the last 20 min  BP is 168/88  and 166/90  Acutly sick, upper resportiory infection

## 2024-05-06 ENCOUNTER — Ambulatory Visit: Admitting: Nurse Practitioner
